# Patient Record
Sex: Male | Born: 1980 | Race: White | Hispanic: No | Marital: Married | Smoking: Former smoker
Health system: Southern US, Community
[De-identification: ages and names within clinical notes are randomized; demographics above are authoritative.]

## PROBLEM LIST (undated history)

## (undated) DIAGNOSIS — E785 Hyperlipidemia, unspecified: Secondary | ICD-10-CM

## (undated) HISTORY — PX: EYE SURGERY: SHX253

## (undated) HISTORY — DX: Hyperlipidemia, unspecified: E78.5

---

## 2000-10-26 ENCOUNTER — Emergency Department (HOSPITAL_COMMUNITY): Admission: EM | Admit: 2000-10-26 | Discharge: 2000-10-26 | Payer: Self-pay | Admitting: Emergency Medicine

## 2003-07-12 ENCOUNTER — Emergency Department (HOSPITAL_COMMUNITY): Admission: EM | Admit: 2003-07-12 | Discharge: 2003-07-12 | Payer: Self-pay | Admitting: Emergency Medicine
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
## 2011-08-30 HISTORY — PX: WISDOM TOOTH EXTRACTION: SHX21

## 2012-09-29 ENCOUNTER — Ambulatory Visit: Payer: 59 | Admitting: Emergency Medicine

## 2012-09-29 VITALS — BP 126/84 | HR 70 | Temp 98.0°F | Resp 18 | Ht 70.5 in | Wt 199.0 lb

## 2012-09-29 DIAGNOSIS — J40 Bronchitis, not specified as acute or chronic: Secondary | ICD-10-CM

## 2012-09-29 DIAGNOSIS — J45909 Unspecified asthma, uncomplicated: Secondary | ICD-10-CM

## 2012-09-29 MED ORDER — AZITHROMYCIN 250 MG PO TABS: ORAL_TABLET | ORAL | Status: AC

## 2012-09-29 MED ORDER — ALBUTEROL SULFATE HFA 108 (90 BASE) MCG/ACT IN AERS: 2.0000 | INHALATION_SPRAY | RESPIRATORY_TRACT | Status: DC | PRN

## 2012-09-29 MED ORDER — HYDROCODONE-HOMATROPINE 5-1.5 MG/5ML PO SYRP: 5.0000 mL | ORAL_SOLUTION | Freq: Three times a day (TID) | ORAL | Status: AC | PRN

## 2012-09-29 MED ORDER — ALBUTEROL SULFATE (2.5 MG/3ML) 0.083% IN NEBU
2.5000 mg | INHALATION_SOLUTION | Freq: Once | RESPIRATORY_TRACT | Status: AC
  Administered 2012-09-29: 2.5 mg via RESPIRATORY_TRACT

## 2012-09-29 NOTE — Progress Notes (Signed)
  Subjective:    Patient ID: Gary Werner, male    DOB: 05-31-1981, 32 y.o.   MRN: [id number]  HPI  Pt  Loosing voice, feels constricted with breathing, coughs with wheezing.  Feels lump in throat Wed am - began feeling bad last night. Dry cough until late last night-coughing up product. Had a similar episode last year - used inhalers & cough medicine.      Review of Systems     Objective:   Physical Exam HEENT clear,  Lungs clear. Patient has a hoarse voice I did not hear any definite wheezing on exam   Heart regular rate and rhythm      Assessment & Plan:  Peak flow  475   Albuterol nebulizer treatment. We'll treat with cough medications Z-Pak and albuterol HFA .

## 2012-09-29 NOTE — Patient Instructions (Addendum)
Bronchitis Bronchitis is the body's way of reacting to injury and/or infection (inflammation) of the bronchi. Bronchi are the air tubes that extend from the windpipe into the lungs. If the inflammation becomes severe, it may cause shortness of breath. CAUSES  Inflammation may be caused by:  A virus.  Germs (bacteria).  Dust.  Allergens.  Pollutants and many other irritants. The cells lining the bronchial tree are covered with tiny hairs (cilia). These constantly beat upward, away from the lungs, toward the mouth. This keeps the lungs free of pollutants. When these cells become too irritated and are unable to do their job, mucus begins to develop. This causes the characteristic cough of bronchitis. The cough clears the lungs when the cilia are unable to do their job. Without either of these protective mechanisms, the mucus would settle in the lungs. Then you would develop pneumonia. Smoking is a common cause of bronchitis and can contribute to pneumonia. Stopping this habit is the single most important thing you can do to help yourself. TREATMENT   Your caregiver may prescribe an antibiotic if the cough is caused by bacteria. Also, medicines that open up your airways make it easier to breathe. Your caregiver may also recommend or prescribe an expectorant. It will loosen the mucus to be coughed up. Only take over-the-counter or prescription medicines for pain, discomfort, or fever as directed by your caregiver.  Removing whatever causes the problem (smoking, for example) is critical to preventing the problem from getting worse.  Cough suppressants may be prescribed for relief of cough symptoms.  Inhaled medicines may be prescribed to help with symptoms now and to help prevent problems from returning.  For those with recurrent (chronic) bronchitis, there may be a need for steroid medicines. SEEK IMMEDIATE MEDICAL CARE IF:   During treatment, you develop more pus-like mucus (purulent  sputum).  You have a fever.  Your baby is older than 3 months with a rectal temperature of 102 F (38.9 C) or higher.  Your baby is 78 months old or younger with a rectal temperature of 100.4 F (38 C) or higher.  You become progressively more ill.  You have increased difficulty breathing, wheezing, or shortness of breath. It is necessary to seek immediate medical care if you are elderly or sick from any other disease. MAKE SURE YOU:   Understand these instructions.  Will watch your condition.  Will get help right away if you are not doing well or get worse. Document Released: 08/15/2005 Document Revised: 11/07/2011 Document Reviewed: 06/24/2008 [facility] Patient Information 2013 [place], [place]. Laryngitis At the top of your windpipe is your voice box. It is the source of your voice. Inside your voice box are 2 bands of muscles called vocal cords. When you breathe, your vocal cords are relaxed and open so that air can get into the lungs. When you decide to say something, these cords come together and vibrate. The sound from these vibrations goes into your throat and comes out through your mouth as sound. Laryngitis is an inflammation of the vocal cords that causes hoarseness, cough, loss of voice, sore throat, and dry throat. Laryngitis can be temporary (acute) or long-term (chronic). Most cases of acute laryngitis improve with time.Chronic laryngitis lasts for more than 3 weeks. CAUSES Laryngitis can often be related to excessive smoking, talking, or yelling, as well as inhalation of toxic fumes and allergies. Acute laryngitis is usually caused by a viral infection, vocal strain, measles or mumps, or bacterial infections. Chronic laryngitis is  usually caused by vocal cord strain, vocal cord injury, postnasal drip, growths on the vocal cords, or acid reflux. SYMPTOMS   Cough.  Sore throat.  Dry throat. RISK FACTORS  Respiratory infections.  Exposure to irritating substances,  such as cigarette smoke, excessive amounts of alcohol, stomach acids, and workplace chemicals.  Voice trauma, such as vocal cord injury from shouting or speaking too loud. DIAGNOSIS  Your cargiver will perform a physical exam. During the physical exam, your caregiver will examine your throat. The most common sign of laryngitis is hoarseness. Laryngoscopy may be necessary to confirm the diagnosis of this condition. This procedure allows your caregiver to look into the larynx. HOME CARE INSTRUCTIONS  Drink enough fluids to keep your urine clear or pale yellow.  Rest until you no longer have symptoms or as directed by your caregiver.  Breathe in moist air.  Take all medicine as directed by your caregiver.  Do not smoke.  Talk as little as possible (this includes whispering).  Write on paper instead of talking until your voice is back to normal.  Follow up with your caregiver if your condition has not improved after 10 days. SEEK MEDICAL CARE IF:   You have trouble breathing.  You cough up blood.  You have persistent fever.  You have increasing pain.  You have difficulty swallowing. MAKE SURE YOU:  Understand these instructions.  Will watch your condition.  Will get help right away if you are not doing well or get worse. Document Released: 08/15/2005 Document Revised: 11/07/2011 Document Reviewed: 10/21/2010 [facility] Patient Information 2013 [place], [place].

## 2019-02-27 ENCOUNTER — Other Ambulatory Visit: Payer: Self-pay | Admitting: Orthopedic Surgery

## 2019-02-27 ENCOUNTER — Other Ambulatory Visit (HOSPITAL_COMMUNITY): Payer: Self-pay | Admitting: Orthopedic Surgery

## 2019-02-27 DIAGNOSIS — G8929 Other chronic pain: Secondary | ICD-10-CM

## 2019-03-07 ENCOUNTER — Encounter (HOSPITAL_COMMUNITY): Payer: Self-pay

## 2019-03-07 ENCOUNTER — Other Ambulatory Visit: Payer: Self-pay

## 2019-03-07 ENCOUNTER — Ambulatory Visit (HOSPITAL_COMMUNITY)
Admission: RE | Admit: 2019-03-07 | Discharge: 2019-03-07 | Disposition: A | Discharge status: Home / Self Care | Payer: 59 | Source: Ambulatory Visit | Attending: Orthopedic Surgery | Admitting: Orthopedic Surgery

## 2019-03-07 DIAGNOSIS — G8929 Other chronic pain: Secondary | ICD-10-CM

## 2019-03-07 DIAGNOSIS — M25561 Pain in right knee: Secondary | ICD-10-CM

## 2019-03-15 ENCOUNTER — Ambulatory Visit (HOSPITAL_COMMUNITY): Admission: RE | Admit: 2019-03-15 | Payer: 59 | Source: Ambulatory Visit

## 2020-04-06 ENCOUNTER — Ambulatory Visit
Admission: EM | Admit: 2020-04-06 | Discharge: 2020-04-06 | Disposition: A | Discharge status: Home / Self Care | Payer: 59 | Attending: Emergency Medicine | Admitting: Emergency Medicine

## 2020-04-06 ENCOUNTER — Other Ambulatory Visit: Payer: Self-pay

## 2020-04-06 DIAGNOSIS — J069 Acute upper respiratory infection, unspecified: Secondary | ICD-10-CM

## 2020-04-06 DIAGNOSIS — Z1152 Encounter for screening for COVID-19: Secondary | ICD-10-CM | POA: Diagnosis not present

## 2020-04-06 MED ORDER — FLUTICASONE PROPIONATE 50 MCG/ACT NA SUSP: 1.0000 | Freq: Every day | NASAL | 0 refills | Status: AC

## 2020-04-06 MED ORDER — BENZONATATE 100 MG PO CAPS: 100.0000 mg | ORAL_CAPSULE | Freq: Three times a day (TID) | ORAL | 0 refills | Status: AC

## 2020-04-06 MED ORDER — CETIRIZINE HCL 10 MG PO TABS: 10.0000 mg | ORAL_TABLET | Freq: Every day | ORAL | 0 refills | Status: AC

## 2020-04-06 NOTE — Discharge Instructions (Addendum)
COVID testing ordered.  It will take between 2-7 days for test results.  Someone will contact you regarding abnormal results.    In the meantime: You should remain isolated in your home for 10 days from symptom onset AND greater than 24 hours after symptoms resolution (absence of fever without the use of fever-reducing medication and improvement in respiratory symptoms), whichever is longer Get plenty of rest and push fluids Tessalon Perles prescribed for cough Zyrtec for nasal congestion, runny nose, and/or sore throat Flonase for nasal congestion and runny nose Use medications daily for symptom relief Use OTC medications like ibuprofen or tylenol as needed fever or pain Call or go to the ED if you have any new or worsening symptoms such as fever, worsening cough, shortness of breath, chest tightness, chest pain, turning blue, changes in mental status, etc...  

## 2020-04-06 NOTE — ED Triage Notes (Signed)
Pt presents with c/o chills nasal congestion and some mild sob

## 2020-04-06 NOTE — ED Provider Notes (Signed)
[facility]   [id number] 04/06/20 Arrival Time: 1413   CC: Covid symptoms   SUBJECTIVE: History from: patient.  Gary Werner is a 39 y.o. male who presented to the urgent care for complaint of chills and fever, fatigue, nasal congestion, mild shortness of breath body aches for the past 4 days.  Denies sick exposure to COVID, flu or strep.  Denies recent travel.  Has no tried any medication.  Denies alleviating or aggravating factors.  Denies previous symptoms in the past.   Denies sinus pain, rhinorrhea, sore throat, SOB, wheezing, chest pain, nausea, changes in bowel or bladder habits.    ROS: As per HPI.  All other pertinent ROS negative.      Past Medical History:  Diagnosis Date  . Hyperlipidemia    Past Surgical History:  Procedure Laterality Date  . EYE SURGERY    . WISDOM TOOTH EXTRACTION  2013   No Known Allergies No current facility-administered medications on file prior to encounter.   Current Outpatient Medications on File Prior to Encounter  Medication Sig Dispense Refill  . albuterol (PROVENTIL HFA;VENTOLIN HFA) 108 (90 BASE) MCG/ACT inhaler Inhale 2 puffs into the lungs every 4 (four) hours as needed for wheezing (cough, shortness of breath or wheezing.). 1 Inhaler 1  . azithromycin (ZITHROMAX) 250 MG tablet Take 2 tabs PO x 1 dose, then 1 tab PO QD x 4 days 6 tablet 0  . fish oil-omega-3 fatty acids 1000 MG capsule Take 2 g by mouth daily.    Marland Kitchen HYDROcodone-homatropine (HYCODAN) 5-1.5 MG/5ML syrup Take 5 mLs by mouth every 8 (eight) hours as needed for cough. 120 mL 0  . Multiple Vitamins-Minerals (MULTIVITAMIN WITH MINERALS) tablet Take 1 tablet by mouth daily.    . simvastatin (ZOCOR) 20 MG tablet Take 20 mg by mouth every evening.     Social History   Socioeconomic History  . Marital status: Married    Spouse name: Not on file  . Number of children: Not on file  . Years of education: Not on file  . Highest education level: Not on file    Occupational History  . Not on file  Tobacco Use  . Smoking status: Former Games developer  . Smokeless tobacco: Never Used  Substance and Sexual Activity  . Alcohol use: Yes  . Drug use: Not Currently  . Sexual activity: Not on file  Other Topics Concern  . Not on file  Social History Narrative  . Not on file   Social Determinants of Health   Financial Resource Strain:   . Difficulty of Paying Living Expenses:   Food Insecurity:   . Worried About Programme researcher, broadcasting/film/video in the Last Year:   . Barista in the Last Year:   Transportation Needs:   . Freight forwarder (Medical):   Marland Kitchen Lack of Transportation (Non-Medical):   Physical Activity:   . Days of Exercise per Week:   . Minutes of Exercise per Session:   Stress:   . Feeling of Stress :   Social Connections:   . Frequency of Communication with Friends and Family:   . Frequency of Social Gatherings with Friends and Family:   . Attends Religious Services:   . Active Member of Clubs or Organizations:   . Attends Banker Meetings:   Marland Kitchen Marital Status:   Intimate Partner Violence:   . Fear of Current or Ex-Partner:   . Emotionally Abused:   Marland Kitchen Physically Abused:   .  Sexually Abused:    History reviewed. No pertinent family history.  OBJECTIVE:  Vitals:   04/06/20 1442  BP: (!) 133/95  Pulse: 71  Resp: 20  Temp: 98.1 F (36.7 C)  SpO2: 98%     General appearance: alert; appears fatigued, but nontoxic; speaking in full sentences and tolerating own secretions HEENT: NCAT; Ears: EACs clear, TMs pearly gray; Eyes: PERRL.  EOM grossly intact. Sinuses: nontender; Nose: nares patent without rhinorrhea, Throat: oropharynx clear, tonsils non erythematous or enlarged, uvula midline  Neck: supple without LAD Lungs: unlabored respirations, symmetrical air entry; cough: moderate; no respiratory distress; CTAB Heart: regular rate and rhythm.  Radial pulses 2+ symmetrical bilaterally Skin: warm and  dry Psychological: alert and cooperative; normal mood and affect  LABS:  No results found for this or any previous visit (from the past 24 hour(s)).   ASSESSMENT & PLAN:  1. Encounter for screening for COVID-19   2. URI with cough and congestion     No orders of the defined types were placed in this encounter.   Discharge Instructions.    COVID testing ordered.  It will take between 2-7 days for test results.  Someone will contact you regarding abnormal results.    In the meantime: You should remain isolated in your home for 10 days from symptom onset AND greater than 24 hours after symptoms resolution (absence of fever without the use of fever-reducing medication and improvement in respiratory symptoms), whichever is longer Get plenty of rest and push fluids Tessalon Perles prescribed for cough Zyrtec for nasal congestion, runny nose, and/or sore throat \\Flonase  for nasal congestion and runny nose Use medications daily for symptom relief Use OTC medications like ibuprofen or tylenol as needed fever or pain Call or go to the ED if you have any new or worsening symptoms such as fever, worsening cough, shortness of breath, chest tightness, chest pain, turning blue, changes in mental status, etc...   Reviewed expectations re: course of current medical issues. Questions answered. Outlined signs and symptoms indicating need for more acute intervention. Patient verbalized understanding. After Visit Summary given.      Note: This document was prepared using Dragon voice recognition software and may include unintentional dictation errors.    Durward Parcel, FNP 04/06/20 [phone]

## 2020-04-07 LAB — SARS-COV-2, NAA 2 DAY TAT

## 2020-04-07 LAB — NOVEL CORONAVIRUS, NAA: SARS-CoV-2, NAA: NOT DETECTED

## 2021-01-02 ENCOUNTER — Ambulatory Visit
Admission: EM | Admit: 2021-01-02 | Discharge: 2021-01-02 | Disposition: A | Discharge status: Home / Self Care | Payer: 59 | Attending: Family Medicine | Admitting: Family Medicine

## 2021-01-02 ENCOUNTER — Other Ambulatory Visit: Payer: Self-pay

## 2021-01-02 ENCOUNTER — Encounter: Payer: Self-pay | Admitting: Emergency Medicine

## 2021-01-02 DIAGNOSIS — B9689 Other specified bacterial agents as the cause of diseases classified elsewhere: Secondary | ICD-10-CM

## 2021-01-02 MED ORDER — PREDNISONE 20 MG PO TABS: 20.0000 mg | ORAL_TABLET | Freq: Two times a day (BID) | ORAL | 0 refills | Status: AC

## 2021-01-02 MED ORDER — DOXYCYCLINE HYCLATE 100 MG PO CAPS: 100.0000 mg | ORAL_CAPSULE | Freq: Two times a day (BID) | ORAL | 0 refills | Status: AC

## 2021-01-02 NOTE — ED Provider Notes (Addendum)
[facility]    CSN: [id number] Arrival date & time: 01/02/21  1158      History   Chief Complaint Chief Complaint  Patient presents with  . Insect Bite    HPI Gary Werner is a 40 y.o. male.   HPI Patient presents for worsening rash which initially erupted as an insect bite. Over the last week, the rash increased size, with redness and itchiness. Rash is located right upper chest only .  Past Medical History:  Diagnosis Date  . Hyperlipidemia     There are no problems to display for this patient.   Past Surgical History:  Procedure Laterality Date  . EYE SURGERY    . WISDOM TOOTH EXTRACTION  2013       Home Medications    Prior to Admission medications   Medication Sig Start Date End Date Taking? Authorizing Provider  doxycycline (VIBRAMYCIN) 100 MG capsule Take 1 capsule (100 mg total) by mouth 2 (two) times daily for 7 days. 01/02/21 01/09/21 Yes Bing Neighbors, FNP  predniSONE (DELTASONE) 20 MG tablet Take 1 tablet (20 mg total) by mouth 2 (two) times daily with a meal for 5 days. 01/02/21 01/07/21 Yes Bing Neighbors, FNP  albuterol (PROVENTIL HFA;VENTOLIN HFA) 108 (90 BASE) MCG/ACT inhaler Inhale 2 puffs into the lungs every 4 (four) hours as needed for wheezing (cough, shortness of breath or wheezing.). 09/29/12   Collene Gobble, MD  azithromycin (ZITHROMAX) 250 MG tablet Take 2 tabs PO x 1 dose, then 1 tab PO QD x 4 days 09/29/12   Collene Gobble, MD  benzonatate (TESSALON) 100 MG capsule Take 1 capsule (100 mg total) by mouth every 8 (eight) hours. 04/06/20   Avegno, Zachery Dakins, FNP  cetirizine (ZYRTEC ALLERGY) 10 MG tablet Take 1 tablet (10 mg total) by mouth daily. 04/06/20   Avegno, Zachery Dakins, FNP  fish oil-omega-3 fatty acids 1000 MG capsule Take 2 g by mouth daily.    [provider]  fluticasone (FLONASE) 50 MCG/ACT nasal spray Place 1 spray into both nostrils daily for 14 days. 04/06/20 04/20/20  Avegno, Zachery Dakins, FNP   HYDROcodone-homatropine (HYCODAN) 5-1.5 MG/5ML syrup Take 5 mLs by mouth every 8 (eight) hours as needed for cough. 09/29/12   Collene Gobble, MD  Multiple Vitamins-Minerals (MULTIVITAMIN WITH MINERALS) tablet Take 1 tablet by mouth daily.    [provider]  simvastatin (ZOCOR) 20 MG tablet Take 20 mg by mouth every evening.    [provider]    Family History No family history on file.  Social History Social History   Tobacco Use  . Smoking status: Former Games developer  . Smokeless tobacco: Never Used  Substance Use Topics  . Alcohol use: Yes  . Drug use: Not Currently     Allergies   Patient has no known allergies.   Review of Systems Review of Systems   Physical Exam Triage Vital Signs ED Triage Vitals  Enc Vitals Group     BP 01/02/21 1226 (!) 144/88     Pulse Rate 01/02/21 1226 84     Resp 01/02/21 1226 18     Temp 01/02/21 1226 98.3 F (36.8 C)     Temp Source 01/02/21 1226 Oral     SpO2 01/02/21 1226 98 %     Weight --      Height --      Head Circumference --      Peak Flow --  Pain Score 01/02/21 1228 0     Pain Loc --      Pain Edu? --      Excl. in GC? --    No data found.  Updated Vital Signs BP (!) 144/88 (BP Location: Right Arm)   Pulse 84   Temp 98.3 F (36.8 C) (Oral)   Resp 18   SpO2 98%   Visual Acuity Right Eye Distance:   Left Eye Distance:   Bilateral Distance:    Right Eye Near:   Left Eye Near:    Bilateral Near:     Physical Exam General appearance: alert, well developed, well nourished, cooperative and in no distress Head: Normocephalic, without obvious abnormality, atraumatic Respiratory: Respirations even and unlabored, normal respiratory rate Heart: Rate and rhythm normal.  Extremities: No gross deformities Skin: Erythematous indurated skin eruption, warm, tender,  right chest wall Psych: Appropriate mood and affect. Neurologic: Mental status: Alert, oriented to person, place, and time, thought  content appropriate.   UC Treatments / Results  Labs (all labs ordered are listed, but only abnormal results are displayed) Labs Reviewed - No data to display  EKG   Radiology No results found.  Procedures Procedures (including critical care time)  Medications Ordered in UC  Initial Impression / Assessment and Plan / UC Course  I have reviewed the triage vital signs and the nursing notes.  Pertinent labs & imaging results that were available during my care of the patient were reviewed by me and considered in my medical decision making (see chart for details).     Bacterial skin infection suspect insect bite of unknown etiology. Treatment with Doxycyline and prednisone. Avoid scratching, return precautions if symptoms worsen or do not improve.  Final Clinical Impressions(s) / UC Diagnoses   Final diagnoses:  Skin infection, bacterial   Discharge Instructions   None    ED Prescriptions    Medication Sig Dispense Auth. Provider   doxycycline (VIBRAMYCIN) 100 MG capsule Take 1 capsule (100 mg total) by mouth 2 (two) times daily for 7 days. 14 capsule Bing Neighbors, FNP   predniSONE (DELTASONE) 20 MG tablet Take 1 tablet (20 mg total) by mouth 2 (two) times daily with a meal for 5 days. 10 tablet Bing Neighbors, FNP     PDMP not reviewed this encounter.   Bing Neighbors, FNP 01/05/21 0900    Bing Neighbors, FNP 01/05/21 0900

## 2021-01-02 NOTE — ED Triage Notes (Signed)
Insect bite to RT upper chest on Sunday, area is now red, itchy  and sore.

## 2021-05-31 ENCOUNTER — Other Ambulatory Visit: Payer: Self-pay

## 2021-05-31 ENCOUNTER — Ambulatory Visit
Admission: RE | Admit: 2021-05-31 | Discharge: 2021-05-31 | Disposition: A | Discharge status: Home / Self Care | Payer: 59 | Source: Ambulatory Visit

## 2021-05-31 VITALS — BP 133/84 | HR 74 | Temp 98.2°F | Resp 18

## 2021-05-31 DIAGNOSIS — J4521 Mild intermittent asthma with (acute) exacerbation: Secondary | ICD-10-CM | POA: Diagnosis not present

## 2021-05-31 DIAGNOSIS — J45909 Unspecified asthma, uncomplicated: Secondary | ICD-10-CM

## 2021-05-31 DIAGNOSIS — J209 Acute bronchitis, unspecified: Secondary | ICD-10-CM | POA: Diagnosis not present

## 2021-05-31 MED ORDER — ALBUTEROL SULFATE HFA 108 (90 BASE) MCG/ACT IN AERS: 2.0000 | INHALATION_SPRAY | RESPIRATORY_TRACT | 0 refills | Status: AC | PRN

## 2021-05-31 MED ORDER — PREDNISONE 20 MG PO TABS: 40.0000 mg | ORAL_TABLET | Freq: Every day | ORAL | 0 refills | Status: AC

## 2021-05-31 MED ORDER — PROMETHAZINE-DM 6.25-15 MG/5ML PO SYRP: 5.0000 mL | ORAL_SOLUTION | Freq: Four times a day (QID) | ORAL | 0 refills | Status: AC | PRN

## 2021-05-31 NOTE — ED Provider Notes (Signed)
[facility]    CSN: [id number] Arrival date & time: 05/31/21  1447      History   Chief Complaint Chief Complaint  Patient presents with   appointment @3p    Cough    HPI Gary Werner is a 40 y.o. male.   Patient presenting today with ongoing cough, intermittent and occasional chest tightness and wheezing following COVID diagnosis on 05/23/2021.  States that the majority of his symptoms have resolved but the cough lingers.  He states he often gets this way after being sick where he will get bronchitis.  Does have a history of reactive airway and seasonal allergies, does not have an inhaler at home at this time.  Denies fever, chills, chest pain, shortness of breath, abdominal pain, nausea vomiting or diarrhea.  Took Robitussin last night which helped tremendously with the symptoms.   Past Medical History:  Diagnosis Date   Hyperlipidemia     There are no problems to display for this patient.   Past Surgical History:  Procedure Laterality Date   EYE SURGERY     WISDOM TOOTH EXTRACTION  2013       Home Medications    Prior to Admission medications   Medication Sig Start Date End Date Taking? Authorizing Provider  predniSONE (DELTASONE) 20 MG tablet Take 2 tablets (40 mg total) by mouth daily with breakfast. 05/31/21  Yes 07/31/21, PA-C  promethazine-dextromethorphan (PROMETHAZINE-DM) 6.25-15 MG/5ML syrup Take 5 mLs by mouth 4 (four) times daily as needed for cough. 05/31/21  Yes 07/31/21, PA-C  albuterol (VENTOLIN HFA) 108 (90 Base) MCG/ACT inhaler Inhale 2 puffs into the lungs every 4 (four) hours as needed for wheezing (cough, shortness of breath or wheezing.). 05/31/21   07/31/21, PA-C  azithromycin (ZITHROMAX) 250 MG tablet Take 2 tabs PO x 1 dose, then 1 tab PO QD x 4 days 09/29/12   11/27/12, MD  benzonatate (TESSALON) 100 MG capsule Take 1 capsule (100 mg total) by mouth every 8 (eight) hours. 04/06/20    Avegno, 06/06/20, FNP  cetirizine (ZYRTEC ALLERGY) 10 MG tablet Take 1 tablet (10 mg total) by mouth daily. 04/06/20   Avegno, 06/06/20, FNP  fish oil-omega-3 fatty acids 1000 MG capsule Take 2 g by mouth daily.    [provider]  fluticasone (FLONASE) 50 MCG/ACT nasal spray Place 1 spray into both nostrils daily for 14 days. 04/06/20 04/20/20  Avegno, 04/22/20, FNP  HYDROcodone-homatropine (HYCODAN) 5-1.5 MG/5ML syrup Take 5 mLs by mouth every 8 (eight) hours as needed for cough. 09/29/12   11/27/12, MD  Multiple Vitamins-Minerals (MULTIVITAMIN WITH MINERALS) tablet Take 1 tablet by mouth daily.    [provider]  rosuvastatin (CRESTOR) 20 MG tablet SMARTSIG:1 Tablet(s) By Mouth Every Evening 05/05/21   [provider]  simvastatin (ZOCOR) 20 MG tablet Take 20 mg by mouth every evening.    [provider]    Family History History reviewed. No pertinent family history.  Social History Social History   Tobacco Use   Smoking status: Former   Smokeless tobacco: Never  Substance Use Topics   Alcohol use: Yes   Drug use: Not Currently     Allergies   Patient has no known allergies.   Review of Systems Review of Systems Per HPI  Physical Exam Triage Vital Signs ED Triage Vitals  Enc Vitals Group     BP 05/31/21 1545 133/84  Pulse Rate 05/31/21 1545 74     Resp 05/31/21 1545 18     Temp 05/31/21 1545 98.2 F (36.8 C)     Temp Source 05/31/21 1545 Oral     SpO2 05/31/21 1545 96 %     Weight --      Height --      Head Circumference --      Peak Flow --      Pain Score 05/31/21 1547 0     Pain Loc --      Pain Edu? --      Excl. in GC? --    No data found.  Updated Vital Signs BP 133/84 (BP Location: Right Arm)   Pulse 74   Temp 98.2 F (36.8 C) (Oral)   Resp 18   SpO2 96%   Visual Acuity Right Eye Distance:   Left Eye Distance:   Bilateral Distance:    Right Eye Near:   Left Eye Near:    Bilateral Near:      Physical Exam Vitals and nursing note reviewed.  Constitutional:      Appearance: Normal appearance.  HENT:     Head: Atraumatic.     Nose: Nose normal.     Mouth/Throat:     Mouth: Mucous membranes are moist.     Pharynx: Oropharynx is clear.  Eyes:     Extraocular Movements: Extraocular movements intact.     Conjunctiva/sclera: Conjunctivae normal.  Cardiovascular:     Rate and Rhythm: Normal rate and regular rhythm.  Pulmonary:     Effort: Pulmonary effort is normal.     Breath sounds: Normal breath sounds. No wheezing or rales.  Musculoskeletal:        General: Normal range of motion.     Cervical back: Normal range of motion and neck supple.  Skin:    General: Skin is warm and dry.  Neurological:     General: No focal deficit present.     Mental Status: He is oriented to person, place, and time.     Motor: No weakness.     Gait: Gait normal.  Psychiatric:        Mood and Affect: Mood normal.        Thought Content: Thought content normal.        Judgment: Judgment normal.     UC Treatments / Results  Labs (all labs ordered are listed, but only abnormal results are displayed) Labs Reviewed - No data to display  EKG   Radiology No results found.  Procedures Procedures (including critical care time)  Medications Ordered in UC Medications - No data to display  Initial Impression / Assessment and Plan / UC Course  I have reviewed the triage vital signs and the nursing notes.  Pertinent labs & imaging results that were available during my care of the patient were reviewed by me and considered in my medical decision making (see chart for details).     Vitals and exam overall benign and well-appearing, but given his history of bronchitis and his intermittent chest tightness, wheezing will treat for bronchitis with prednisone, Phenergan DM, albuterol inhaler for as needed use.  Discussed supportive over-the-counter medications and home care.  Return for  acutely worsening symptoms.  Final Clinical Impressions(s) / UC Diagnoses   Final diagnoses:  Acute bronchitis, unspecified organism  RAD (reactive airway disease)   Discharge Instructions   None    ED Prescriptions     Medication Sig Dispense Auth.  Provider   albuterol (VENTOLIN HFA) 108 (90 Base) MCG/ACT inhaler Inhale 2 puffs into the lungs every 4 (four) hours as needed for wheezing (cough, shortness of breath or wheezing.). 1 each Particia Nearing, PA-C   promethazine-dextromethorphan (PROMETHAZINE-DM) 6.25-15 MG/5ML syrup Take 5 mLs by mouth 4 (four) times daily as needed for cough. 100 mL Particia Nearing, PA-C   predniSONE (DELTASONE) 20 MG tablet Take 2 tablets (40 mg total) by mouth daily with breakfast. 10 tablet Particia Nearing, [place]      PDMP not reviewed this encounter.   Particia Nearing, [place] 05/31/21 1620

## 2021-05-31 NOTE — ED Triage Notes (Signed)
Tested positive with at home covid test on 9/25. Pt reports that his sxs have decreased some but still has some cough and chest congestion.

## 2024-08-06 NOTE — Progress Notes (Unsigned)
 No chief complaint on file.  History of Present Illness: 43 yo male with history of HLD who is here today as a new consult, referred by Dr. ***, for the evaluation of *** chest pain.   Primary Care Physician: Almarie Oneil BROCKS., MD   Past Medical History:  Diagnosis Date   Hyperlipidemia     Past Surgical History:  Procedure Laterality Date   EYE SURGERY     WISDOM TOOTH EXTRACTION  2013    Current Outpatient Medications  Medication Sig Dispense Refill   albuterol  (VENTOLIN  HFA) 108 (90 Base) MCG/ACT inhaler Inhale 2 puffs into the lungs every 4 (four) hours as needed for wheezing (cough, shortness of breath or wheezing.). 1 each 0   azithromycin  (ZITHROMAX ) 250 MG tablet Take 2 tabs PO x 1 dose, then 1 tab PO QD x 4 days 6 tablet 0   benzonatate  (TESSALON ) 100 MG capsule Take 1 capsule (100 mg total) by mouth every 8 (eight) hours. 30 capsule 0   cetirizine  (ZYRTEC  ALLERGY) 10 MG tablet Take 1 tablet (10 mg total) by mouth daily. 30 tablet 0   fish oil-omega-3 fatty acids 1000 MG capsule Take 2 g by mouth daily.     fluticasone  (FLONASE ) 50 MCG/ACT nasal spray Place 1 spray into both nostrils daily for 14 days. 16 g 0   HYDROcodone -homatropine (HYCODAN) 5-1.5 MG/5ML syrup Take 5 mLs by mouth every 8 (eight) hours as needed for cough. 120 mL 0   Multiple Vitamins-Minerals (MULTIVITAMIN WITH MINERALS) tablet Take 1 tablet by mouth daily.     predniSONE  (DELTASONE ) 20 MG tablet Take 2 tablets (40 mg total) by mouth daily with breakfast. 10 tablet 0   promethazine -dextromethorphan (PROMETHAZINE -DM) 6.25-15 MG/5ML syrup Take 5 mLs by mouth 4 (four) times daily as needed for cough. 100 mL 0   rosuvastatin (CRESTOR) 20 MG tablet SMARTSIG:1 Tablet(s) By Mouth Every Evening     simvastatin (ZOCOR) 20 MG tablet Take 20 mg by mouth every evening.     No current facility-administered medications for this visit.    No Known Allergies  Social History   Socioeconomic History   Marital  status: Married    Spouse name: Not on file   Number of children: Not on file   Years of education: Not on file   Highest education level: Not on file  Occupational History   Not on file  Tobacco Use   Smoking status: Former   Smokeless tobacco: Never  Substance and Sexual Activity   Alcohol use: Yes   Drug use: Not Currently   Sexual activity: Not on file  Other Topics Concern   Not on file  Social History Narrative   Not on file   Social Drivers of Health   Financial Resource Strain: Not on file  Food Insecurity: Not on file  Transportation Needs: Not on file  Physical Activity: Not on file  Stress: Not on file  Social Connections: Not on file  Intimate Partner Violence: Not on file    No family history on file.  Review of Systems:  As stated in the HPI and otherwise negative.   There were no vitals taken for this visit.  Physical Examination: General: Well developed, well nourished, NAD  HEENT: OP clear, mucus membranes moist  SKIN: warm, dry. No rashes. Neuro: No focal deficits  Musculoskeletal: Muscle strength 5/5 all ext  Psychiatric: Mood and affect normal  Neck: No JVD, no carotid bruits, no thyromegaly, no lymphadenopathy.  Lungs:Clear  bilaterally, no wheezes, rhonci, crackles Cardiovascular: Regular rate and rhythm. No murmurs, gallops or rubs. Abdomen:Soft. Bowel sounds present. Non-tender.  Extremities: No lower extremity edema. Pulses are 2 + in the bilateral DP/PT.  EKG:  EKG {ACTION; IS/IS [id number]} ordered today. The ekg ordered today demonstrates ***  Recent Labs: No results found for requested labs within last 365 days.   Lipid Panel No results found for: CHOL, TRIG, HDL, CHOLHDL, VLDL, LDLCALC, LDLDIRECT   Wt Readings from Last 3 Encounters:  09/29/12 199 lb (90.3 kg)      Assessment and Plan:   1.   Labs/ tests ordered today include:  No orders of the defined types were placed in this  encounter.    Disposition:   F/U with me in ***    Signed, Lonni Cash, MD, [facility] 08/06/2024 2:05 PM    [facility] [address] [place], [place], [place]  [address] Phone: [phone]; Fax: [phone]

## 2024-08-07 ENCOUNTER — Ambulatory Visit: Attending: Cardiovascular Disease | Admitting: Cardiovascular Disease

## 2024-08-07 ENCOUNTER — Encounter: Payer: Self-pay | Admitting: Cardiovascular Disease

## 2024-08-07 VITALS — BP 120/76 | HR 70 | Ht 70.0 in | Wt 177.0 lb

## 2024-08-07 DIAGNOSIS — Z136 Encounter for screening for cardiovascular disorders: Secondary | ICD-10-CM

## 2024-08-07 DIAGNOSIS — R079 Chest pain, unspecified: Secondary | ICD-10-CM

## 2024-08-07 NOTE — Patient Instructions (Signed)
 Medication Instructions:  Your physician recommends that you continue on your current medications as directed. Please refer to the Current Medication list given to you today.  *If you need a refill on your cardiac medications before your next appointment, please call your pharmacy*  Lab Work: none If you have labs (blood work) drawn today and your tests are completely normal, you will receive your results only by: MyChart Message (if you have MyChart) OR A paper copy in the mail If you have any lab test that is abnormal or we need to change your treatment, we will call you to review the results.  Testing/Procedures: Dr Verlin has requested you have a Calcium Score CT scan  Follow-Up: At [facility], you and your health needs are our priority.  As part of our continuing mission to provide you with exceptional heart care, our providers are all part of one team.  This team includes your primary Cardiologist (physician) and Advanced Practice Providers or APPs (Physician Assistants and Nurse Practitioners) who all work together to provide you with the care you need, when you need it.  Your next appointment:   As needed  Provider:   Lonni Verlin, MD    We recommend signing up for the patient portal called MyChart.  Sign up information is provided on this After Visit Summary.  MyChart is used to connect with patients for Virtual Visits (Telemedicine).  Patients are able to view lab/test results, encounter notes, upcoming appointments, etc.  Non-urgent messages can be sent to your provider as well.   To learn more about what you can do with MyChart, go to [email or web address].   Other Instructions

## 2024-08-15 ENCOUNTER — Ambulatory Visit (HOSPITAL_COMMUNITY)
Admission: RE | Admit: 2024-08-15 | Discharge: 2024-08-15 | Payer: Self-pay | Attending: Cardiovascular Disease | Admitting: Cardiovascular Disease

## 2024-08-15 DIAGNOSIS — Z136 Encounter for screening for cardiovascular disorders: Secondary | ICD-10-CM | POA: Insufficient documentation

## 2024-08-15 DIAGNOSIS — R079 Chest pain, unspecified: Secondary | ICD-10-CM | POA: Insufficient documentation

## 2024-08-16 ENCOUNTER — Ambulatory Visit: Payer: Self-pay | Admitting: Cardiovascular Disease

## 2024-08-16 DIAGNOSIS — R079 Chest pain, unspecified: Secondary | ICD-10-CM

## 2024-08-16 DIAGNOSIS — R931 Abnormal findings on diagnostic imaging of heart and coronary circulation: Secondary | ICD-10-CM

## 2024-08-19 MED ORDER — METOPROLOL TARTRATE 100 MG PO TABS: ORAL_TABLET | ORAL | 0 refills | Status: AC

## 2024-08-19 MED ORDER — ROSUVASTATIN CALCIUM 10 MG PO TABS: 10.0000 mg | ORAL_TABLET | Freq: Every day | ORAL | Status: AC

## 2024-08-26 ENCOUNTER — Encounter: Payer: Self-pay | Admitting: Internal Medicine

## 2024-09-04 ENCOUNTER — Ambulatory Visit (HOSPITAL_COMMUNITY)
Admission: RE | Admit: 2024-09-04 | Discharge: 2024-09-04 | Disposition: A | Discharge status: Home / Self Care | Source: Ambulatory Visit | Attending: Internal Medicine | Admitting: Internal Medicine

## 2024-09-04 ENCOUNTER — Ambulatory Visit: Payer: Self-pay | Admitting: Cardiovascular Disease

## 2024-09-04 DIAGNOSIS — R079 Chest pain, unspecified: Secondary | ICD-10-CM | POA: Insufficient documentation

## 2024-09-04 DIAGNOSIS — R931 Abnormal findings on diagnostic imaging of heart and coronary circulation: Secondary | ICD-10-CM | POA: Diagnosis present

## 2024-09-04 MED ORDER — IOHEXOL 350 MG/ML SOLN
100.0000 mL | Freq: Once | INTRAVENOUS | Status: AC | PRN
  Administered 2024-09-04: 100 mL via INTRAVENOUS

## 2024-09-04 MED ORDER — NITROGLYCERIN 0.4 MG SL SUBL
0.8000 mg | SUBLINGUAL_TABLET | Freq: Once | SUBLINGUAL | Status: AC
  Administered 2024-09-04: 0.8 mg via SUBLINGUAL

## 2024-09-25 ENCOUNTER — Encounter (INDEPENDENT_AMBULATORY_CARE_PROVIDER_SITE_OTHER): Payer: Self-pay

## 2024-09-26 ENCOUNTER — Ambulatory Visit: Admitting: Internal Medicine

## 2024-10-30 ENCOUNTER — Ambulatory Visit: Admitting: Gastroenterology
